# Patient Record
Sex: Male | Born: 1970
Health system: Southern US, Community
[De-identification: ages and names within clinical notes are randomized; demographics above are authoritative.]

## PROBLEM LIST (undated history)

## (undated) DIAGNOSIS — R7309 Other abnormal glucose: Secondary | ICD-10-CM

## (undated) DIAGNOSIS — I10 Essential (primary) hypertension: Secondary | ICD-10-CM

## (undated) DIAGNOSIS — R9431 Abnormal electrocardiogram [ECG] [EKG]: Secondary | ICD-10-CM

## (undated) HISTORY — PX: JOINT REPLACEMENT: SHX530

---
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
## 1996-04-22 HISTORY — PX: ANTERIOR CRUCIATE LIGAMENT REPAIR: SHX115

## 2018-11-02 ENCOUNTER — Emergency Department (HOSPITAL_COMMUNITY)
Admission: EM | Admit: 2018-11-02 | Discharge: 2018-11-03 | Disposition: A | Discharge status: Home / Self Care | Payer: Self-pay | Attending: Emergency Medicine | Admitting: Emergency Medicine

## 2018-11-02 ENCOUNTER — Emergency Department (HOSPITAL_COMMUNITY): Payer: Self-pay

## 2018-11-02 ENCOUNTER — Encounter (HOSPITAL_COMMUNITY): Payer: Self-pay | Admitting: *Deleted

## 2018-11-02 DIAGNOSIS — R42 Dizziness and giddiness: Secondary | ICD-10-CM | POA: Insufficient documentation

## 2018-11-02 DIAGNOSIS — Z5321 Procedure and treatment not carried out due to patient leaving prior to being seen by health care provider: Secondary | ICD-10-CM | POA: Insufficient documentation

## 2018-11-02 DIAGNOSIS — R079 Chest pain, unspecified: Secondary | ICD-10-CM | POA: Insufficient documentation

## 2018-11-02 HISTORY — DX: Essential (primary) hypertension: I10

## 2018-11-02 LAB — CBC
HCT: 47.5 % (ref 39.0–52.0)
Hemoglobin: 16.9 g/dL (ref 13.0–17.0)
MCH: 33 pg (ref 26.0–34.0)
MCHC: 35.6 g/dL (ref 30.0–36.0)
MCV: 92.8 fL (ref 80.0–100.0)
Platelets: 164 10*3/uL (ref 150–400)
RBC: 5.12 MIL/uL (ref 4.22–5.81)
RDW: 11.7 % (ref 11.5–15.5)
WBC: 7.8 10*3/uL (ref 4.0–10.5)
nRBC: 0 % (ref 0.0–0.2)

## 2018-11-02 LAB — BASIC METABOLIC PANEL
Anion gap: 15 (ref 5–15)
BUN: 7 mg/dL (ref 6–20)
CO2: 19 mmol/L — ABNORMAL LOW (ref 22–32)
Calcium: 9.7 mg/dL (ref 8.9–10.3)
Chloride: 97 mmol/L — ABNORMAL LOW (ref 98–111)
Creatinine, Ser: 0.78 mg/dL (ref 0.61–1.24)
GFR calc Af Amer: >60 mL/min (ref >60)
GFR calc non Af Amer: >60 mL/min (ref >60)
Glucose, Bld: 191 mg/dL — ABNORMAL HIGH (ref 70–99)
Potassium: 3.5 mmol/L (ref 3.5–5.1)
Sodium: 131 mmol/L — ABNORMAL LOW (ref 135–145)

## 2018-11-02 LAB — TROPONIN I (HIGH SENSITIVITY): Troponin I (High Sensitivity): 11 ng/L (ref <18)

## 2018-11-02 MED ORDER — SODIUM CHLORIDE 0.9% FLUSH: 3.0000 mL | Freq: Once | INTRAVENOUS | Status: DC

## 2018-11-02 NOTE — ED Triage Notes (Signed)
To ED for eval of HTN, dizziness, and chest pressure in center of chest. States he was recently at the beach and forgot bp meds (a couple of days). Today became dizzy and with chest pressure. Took bp at home and states it was 170/130 and HR 140. BP meds taken pta. Minimal chest pressure currently. Dizziness intermittent. No neuro deficits noted. Nausea and vomiting over the past couple of days.

## 2018-11-03 NOTE — ED Notes (Signed)
Pt stated he was going to leave. Seen walking out of ED waiting room to parking lot

## 2018-11-04 ENCOUNTER — Ambulatory Visit
Admission: RE | Admit: 2018-11-04 | Discharge: 2018-11-04 | Disposition: A | Discharge status: Home / Self Care | Payer: 59 | Source: Ambulatory Visit | Attending: Internal Medicine | Admitting: Internal Medicine

## 2018-11-04 ENCOUNTER — Other Ambulatory Visit: Payer: Self-pay | Admitting: Internal Medicine

## 2018-11-04 DIAGNOSIS — R0600 Dyspnea, unspecified: Secondary | ICD-10-CM

## 2018-11-04 MED ORDER — IOPAMIDOL (ISOVUE-370) INJECTION 76%
75.0000 mL | Freq: Once | INTRAVENOUS | Status: AC | PRN
  Administered 2018-11-04: 75 mL via INTRAVENOUS

## 2018-11-11 DIAGNOSIS — R7309 Other abnormal glucose: Secondary | ICD-10-CM

## 2018-11-11 DIAGNOSIS — R9431 Abnormal electrocardiogram [ECG] [EKG]: Secondary | ICD-10-CM

## 2018-11-11 DIAGNOSIS — I10 Essential (primary) hypertension: Secondary | ICD-10-CM | POA: Insufficient documentation

## 2018-11-11 HISTORY — DX: Other abnormal glucose: R73.09

## 2018-11-11 HISTORY — DX: Abnormal electrocardiogram (ECG) (EKG): R94.31

## 2018-11-11 NOTE — Progress Notes (Signed)
Cardiology Office Note:    Date:  11/12/2018   ID:  Nicolas Harris, DOB 06/07/1970, MRN [id number]  PCP:  Trey SailorsPa, [facility]  Cardiologist:  Norman HerrlichBrian Munley, MD   Referring MD: No ref. provider found  ASSESSMENT:    1. Edema of both lower extremities   2. Abnormal EKG   3. Essential hypertension   4. Elevated glucose   5. SOB (shortness of breath)   6. Abnormal LFTs    PLAN:    In order of problems listed above:  1. I suspect his edema is related to liver disease but is at risk for heart failure with hypertension check proBNP level echocardiogram.  If these are normal I will see back in the office as needed 2. In the setting of uncontrolled hypertension EKG normal today check echocardiogram regarding LVH left ventricular function 3. Markedly improved remain on his beta-blocker if he has CNS side effects Bystolic would be a good choice if he requires another agent and given a distal diuretic and I gave him a prescription for clonidine to take in the future for systolics 180 or greater and diastolics greater than 130 4. Elevated glucose he is committed to lifestyle and diet changes presently not on diabetic medication 5. Associated with uncontrolled hypertension resolved check echocardiogram proBNP level 6. I suspect he does have cirrhosis and steatosis responsible for his peripheral edema beta-blocker is a good choice for hypertension as it lowers portal pressures if a diuretic is needed I do spironolactone or Inspra.  Next appointment as needed depending on the results of echocardiogram   Medication Adjustments/Labs and Tests Ordered: Current medicines are reviewed at length with the patient today.  Concerns regarding medicines are outlined above.  Orders Placed This Encounter  Procedures  . Pro b natriuretic peptide (BNP)[facility])  . ECHOCARDIOGRAM COMPLETE   Meds ordered this encounter  Medications  . cloNIDine (CATAPRES) 0.1 MG  tablet    Sig: Take 1 tablet (0.1 mg total) by mouth daily. For systolic greater than 180 or diastolic greater than 120    Dispense:  30 tablet    Refill:  0     No chief complaint on file.   History of Present Illness:    Nicolas Harris is a 48 y.o. male who is being seen today for the evaluation of hypertension abnormal EKG and edema at the request of the patient  EKG 11/02/2018 in the ED showed sinus rhythm abnormal R wave progression no acute ischemic changes.  Triage notes that the patient had missed blood pressure medications recorded BP of 170/130 in setting of complaints of dizziness and chest pressure.  Patient subsequently left the emergency room without further evaluation.  A BMP was performed which showed a glucose of 191 potassium 3.5 sodium 131 a CBC was normal and a high sensitive troponin was in normal range.  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 He has a background history of hypertension has been on medication stopped about 3 years ago since then has gained weight sedentary and alcohol usage. He was on vacation felt as if his blood pressure was high and he was lightheaded and weak some blurred vision short of breath decided to go the emergency room and had systolics in his measurements above 170 and diastolics up to 150.  He sat for long period of time in the emergency room received no treatment and decided to leave.  He was started  on a beta-blocker and his blood pressure is now at target.  He feels much better his weight is down 10 pounds he is not short of breath and he said he never had chest pain during the episodes.  He has been seen by his PCP he has abnormal liver function and tells me a duplex of his liver shows early cirrhosis.  He also has peripheral edema but no orthopnea chest pain palpitation or syncope and no known history of heart disease.  Past Medical History:  Diagnosis Date  . Abnormal EKG 11/11/2018  . Elevated  glucose 11/11/2018  . Hypertension     Past Surgical History:  Procedure Laterality Date  . ANTERIOR CRUCIATE LIGAMENT REPAIR Right 1998  . JOINT REPLACEMENT      Current Medications: Current Meds  Medication Sig  . metoprolol tartrate (LOPRESSOR) 50 MG tablet Take 50 mg by mouth 2 (two) times daily.  . Multiple Vitamin (MULTIVITAMIN) tablet Take 1 tablet by mouth daily.     Allergies:   Patient has no known allergies.   Social History   Socioeconomic History  . Marital status: Divorced    Spouse name: Not on file  . Number of children: Not on file  . Years of education: Not on file  . Highest education level: Not on file  Occupational History  . Not on file  Social Needs  . Financial resource strain: Not on file  . Food insecurity    Worry: Not on file    Inability: Not on file  . Transportation needs    Medical: Not on file    Non-medical: Not on file  Tobacco Use  . Smoking status: Current Every Day Smoker    Packs/day: 0.50    Years: 20.00    Pack years: 10.00    Types: Cigarettes  . Smokeless tobacco: Never Used  Substance and Sexual Activity  . Alcohol use: Not Currently  . Drug use: Not Currently  . Sexual activity: Not on file  Lifestyle  . Physical activity    Days per week: Not on file    Minutes per session: Not on file  . Stress: Not on file  Relationships  . Social Musicianconnections    Talks on phone: Not on file    Gets together: Not on file    Attends religious service: Not on file    Active member of club or organization: Not on file    Attends meetings of clubs or organizations: Not on file    Relationship status: Not on file  Other Topics Concern  . Not on file  Social History Narrative  . Not on file     Family History: The patient's family history includes COPD in his mother; Cancer in his father.  ROS:   Review of Systems  Constitution: Positive for malaise/fatigue.  HENT: Negative.   Eyes: Positive for visual disturbance.   Cardiovascular: Positive for dyspnea on exertion and leg swelling.  Respiratory: Positive for shortness of breath.   Endocrine: Negative.   Hematologic/Lymphatic: Negative.   Skin: Negative.   Musculoskeletal: Negative.   Gastrointestinal: Negative.   Genitourinary: Negative.   Neurological: Positive for dizziness, light-headedness and weakness.  Psychiatric/Behavioral: Negative.   Allergic/Immunologic: Negative.    Please see the history of present illness.     All other systems reviewed and are negative.  EKGs/Labs/Other Studies Reviewed:    The following studies were reviewed today:   EKG:  EKG is  ordered today.  The ekg  ordered today is personally reviewed and demonstrates [place] normal EKG  Recent Labs: 11/02/2018: BUN 7; Creatinine, Ser 0.78; Hemoglobin 16.9; Platelets 164; Potassium 3.5; Sodium 131  Recent Lipid Panel No results found for: CHOL, TRIG, HDL, CHOLHDL, VLDL, LDLCALC, LDLDIRECT  Physical Exam:    VS:  BP 112/82 (BP Location: Left Arm, Patient Position: Sitting, Cuff Size: Large)   Pulse 71   Ht 6' (1.829 m)   Wt 263 lb 6.4 oz (119.5 kg)   SpO2 97%   BMI 35.72 kg/m     Wt Readings from Last 3 Encounters:  11/12/18 263 lb 6.4 oz (119.5 kg)     GEN:  Well nourished, well developed in no acute distress HEENT: Normal NECK: No JVD; No carotid bruits LYMPHATICS: No lymphadenopathy CARDIAC: RRR, no murmurs, rubs, gallops RESPIRATORY:  Clear to auscultation without rales, wheezing or rhonchi  ABDOMEN: Soft, non-tender, non-distended MUSCULOSKELETAL:  1-2+ pitting bilateral lower extremity ankle to knee edema; No deformity  SKIN: Warm and dry NEUROLOGIC:  Alert and oriented x 3 PSYCHIATRIC:  Normal affect     Signed, Shirlee More, MD  11/12/2018 11:39 AM    [place]

## 2018-11-12 ENCOUNTER — Ambulatory Visit (INDEPENDENT_AMBULATORY_CARE_PROVIDER_SITE_OTHER): Payer: 59 | Admitting: Cardiology

## 2018-11-12 ENCOUNTER — Other Ambulatory Visit: Payer: Self-pay

## 2018-11-12 ENCOUNTER — Other Ambulatory Visit: Payer: Self-pay | Admitting: Internal Medicine

## 2018-11-12 VITALS — BP 112/82 | HR 71 | Ht 72.0 in | Wt 263.4 lb

## 2018-11-12 DIAGNOSIS — R9431 Abnormal electrocardiogram [ECG] [EKG]: Secondary | ICD-10-CM | POA: Diagnosis not present

## 2018-11-12 DIAGNOSIS — R7309 Other abnormal glucose: Secondary | ICD-10-CM | POA: Diagnosis not present

## 2018-11-12 DIAGNOSIS — R600 Localized edema: Secondary | ICD-10-CM

## 2018-11-12 DIAGNOSIS — I10 Essential (primary) hypertension: Secondary | ICD-10-CM | POA: Diagnosis not present

## 2018-11-12 DIAGNOSIS — R945 Abnormal results of liver function studies: Secondary | ICD-10-CM

## 2018-11-12 DIAGNOSIS — R7989 Other specified abnormal findings of blood chemistry: Secondary | ICD-10-CM | POA: Insufficient documentation

## 2018-11-12 DIAGNOSIS — R0602 Shortness of breath: Secondary | ICD-10-CM

## 2018-11-12 MED ORDER — CLONIDINE HCL 0.1 MG PO TABS: 0.1000 mg | ORAL_TABLET | Freq: Every day | ORAL | 0 refills | Status: DC

## 2018-11-12 NOTE — Patient Instructions (Signed)
Medication Instructions:  Your physician has recommended you make the following change in your medication:   START: Clonidine 0.1 mg: Take 1 tab daily if systolic BP is greater than 180 or diastolic greater than 120.  If you need a refill on your cardiac medications before your next appointment, please call your pharmacy.   Lab work: Your physician recommends that you return for lab work in: TODAY Pro BNP  If you have labs (blood work) drawn today and your tests are completely normal, you will receive your results only by: Marland Kitchen. MyChart Message (if you have MyChart) OR . A paper copy in the mail If you have any lab test that is abnormal or we need to change your treatment, we will call you to review the results.  Testing/Procedures: Your physician has requested that you have an echocardiogram. Echocardiography is a painless test that uses sound waves to create images of your heart. It provides your doctor with information about the size and shape of your heart and how well your heart's chambers and valves are working. This procedure takes approximately one hour. There are no restrictions for this procedure.    Follow-Up: At [facility], you and your health needs are our priority.  As part of our continuing mission to provide you with exceptional heart care, we have created designated Provider Care Teams.  These Care Teams include your primary Cardiologist (physician) and Advanced Practice Providers (APPs -  Physician Assistants and Nurse Practitioners) who all work together to provide you with the care you need, when you need it. You will need a follow up appointment as needed with Dr. Dulce SellarMunley Any Other Special Instructions Will Be Listed Below (If Applicable).   Echocardiogram An echocardiogram is a procedure that uses painless sound waves (ultrasound) to produce an image of the heart. Images from an echocardiogram can provide important information about:  Signs of coronary artery  disease (CAD).  Aneurysm detection. An aneurysm is a weak or damaged part of an artery wall that bulges out from the normal force of blood pumping through the body.  Heart size and shape. Changes in the size or shape of the heart can be associated with certain conditions, including heart failure, aneurysm, and CAD.  Heart muscle function.  Heart valve function.  Signs of a past heart attack.  Fluid buildup around the heart.  Thickening of the heart muscle.  A tumor or infectious growth around the heart valves. Tell a health care provider about:  Any allergies you have.  All medicines you are taking, including vitamins, herbs, eye drops, creams, and over-the-counter medicines.  Any blood disorders you have.  Any surgeries you have had.  Any medical conditions you have.  Whether you are pregnant or may be pregnant. What are the risks? Generally, this is a safe procedure. However, problems may occur, including:  Allergic reaction to dye (contrast) that may be used during the procedure. What happens before the procedure? No specific preparation is needed. You may eat and drink normally. What happens during the procedure?   An IV tube may be inserted into one of your veins.  You may receive contrast through this tube. A contrast is an injection that improves the quality of the pictures from your heart.  A gel will be applied to your chest.  A wand-like tool (transducer) will be moved over your chest. The gel will help to transmit the sound waves from the transducer.  The sound waves will harmlessly bounce off of your  heart to allow the heart images to be captured in real-time motion. The images will be recorded on a computer. The procedure may vary among health care providers and hospitals. What happens after the procedure?  You may return to your normal, everyday life, including diet, activities, and medicines, unless your health care provider tells you not to do that.  Summary  An echocardiogram is a procedure that uses painless sound waves (ultrasound) to produce an image of the heart.  Images from an echocardiogram can provide important information about the size and shape of your heart, heart muscle function, heart valve function, and fluid buildup around your heart.  You do not need to do anything to prepare before this procedure. You may eat and drink normally.  After the echocardiogram is completed, you may return to your normal, everyday life, unless your health care provider tells you not to do that. This information is not intended to replace advice given to you by your health care provider. Make sure you discuss any questions you have with your health care provider. Document Released: 04/05/2000 Document Revised: 07/30/2018 Document Reviewed: 05/11/2016 Elsevier Patient Education  2020 [employer].

## 2018-11-13 ENCOUNTER — Ambulatory Visit (HOSPITAL_COMMUNITY): Payer: 59 | Attending: Cardiology

## 2018-11-13 DIAGNOSIS — I10 Essential (primary) hypertension: Secondary | ICD-10-CM | POA: Insufficient documentation

## 2018-11-13 LAB — PRO B NATRIURETIC PEPTIDE: NT-Pro BNP: 33 pg/mL (ref 0–121)

## 2018-11-20 ENCOUNTER — Ambulatory Visit
Admission: RE | Admit: 2018-11-20 | Discharge: 2018-11-20 | Disposition: A | Discharge status: Home / Self Care | Payer: 59 | Source: Ambulatory Visit | Attending: Internal Medicine | Admitting: Internal Medicine

## 2018-11-20 DIAGNOSIS — R7989 Other specified abnormal findings of blood chemistry: Secondary | ICD-10-CM

## 2021-01-17 ENCOUNTER — Ambulatory Visit
Admission: EM | Admit: 2021-01-17 | Discharge: 2021-01-17 | Disposition: A | Discharge status: Home / Self Care | Payer: PRIVATE HEALTH INSURANCE | Attending: Internal Medicine | Admitting: Internal Medicine

## 2021-01-17 ENCOUNTER — Other Ambulatory Visit: Payer: Self-pay

## 2021-01-17 ENCOUNTER — Encounter: Payer: Self-pay | Admitting: Emergency Medicine

## 2021-01-17 DIAGNOSIS — T148XXA Other injury of unspecified body region, initial encounter: Secondary | ICD-10-CM | POA: Diagnosis not present

## 2021-01-17 MED ORDER — CYCLOBENZAPRINE HCL 5 MG PO TABS: 5.0000 mg | ORAL_TABLET | Freq: Every evening | ORAL | 1 refills | Status: AC | PRN

## 2021-01-17 MED ORDER — HYDROCODONE-ACETAMINOPHEN 5-325 MG PO TABS: 1.0000 | ORAL_TABLET | Freq: Four times a day (QID) | ORAL | 0 refills | Status: AC | PRN

## 2021-01-17 NOTE — ED Triage Notes (Signed)
Pt presents today with c/o of pain to right buttock area that he believes that he has injured his hamstring. 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 He also reports bruising to area.

## 2021-01-17 NOTE — Discharge Instructions (Addendum)
Icing of the contused area Gentle range of motion exercises Take medications as prescribed Do not operate a vehicle or heavy machinery after taking muscle relaxants.  Muscle relaxants will make you drowsy. Return to urgent care to be reevaluated if symptoms worsen.

## 2021-01-17 NOTE — ED Provider Notes (Signed)
[facility]    CSN: [id number] Arrival date & time: 01/17/21  1236      History   Chief Complaint Chief Complaint  Patient presents with   Leg Pain    right    HPI Nicolas Harris is a 50 y.o. male comes to the urgent care with right thigh pain of 1 day duration.  Patient was playing soccer yesterday when he rolled his right leg over the soccer ball.  He lost his balance but was able to prevent himself from falling.  Patient is describing sharp, throbbing pain over the right hamstrings.  Pain is aggravated by movement.  No known relieving factors.  The hamstrings feel tight.  Bruising is noted over the right hamstring.  No weakness in the lower extremities.   HPI  Past Medical History:  Diagnosis Date   Abnormal EKG 11/11/2018   Elevated glucose 11/11/2018   Hypertension     Patient Active Problem List   Diagnosis Date Noted   Edema of both lower extremities 11/12/2018   SOB (shortness of breath) 11/12/2018   Abnormal LFTs 11/12/2018   Abnormal EKG 11/11/2018   Hypertension 11/11/2018   Elevated glucose 11/11/2018    Past Surgical History:  Procedure Laterality Date   ANTERIOR CRUCIATE LIGAMENT REPAIR Right 1998   JOINT REPLACEMENT         Home Medications    Prior to Admission medications   Medication Sig Start Date End Date Taking? Authorizing Provider  cyclobenzaprine (FLEXERIL) 5 MG tablet Take 1 tablet (5 mg total) by mouth at bedtime as needed for muscle spasms. 01/17/21  Yes Wendelin Reader, Britta Mccreedy, MD  HYDROcodone-acetaminophen (NORCO/VICODIN) 5-325 MG tablet Take 1 tablet by mouth every 6 (six) hours as needed. 01/17/21  Yes Carson Meche, Britta Mccreedy, MD  metoprolol tartrate (LOPRESSOR) 50 MG tablet Take 50 mg by mouth 2 (two) times daily.    [provider]  Multiple Vitamin (MULTIVITAMIN) tablet Take 1 tablet by mouth daily.    [provider]  sertraline (ZOLOFT) 100 MG tablet Take 50 mg by mouth daily. 12/15/20   [provider]    Family History Family History  Problem Relation Age of Onset   COPD Mother    Cancer Father     Social History Social History   Tobacco Use   Smoking status: Every Day    Packs/day: 0.50    Years: 20.00    Pack years: 10.00    Types: Cigarettes   Smokeless tobacco: Never  Vaping Use   Vaping Use: Never used  Substance Use Topics   Alcohol use: Not Currently   Drug use: Not Currently     Allergies   Patient has no known allergies.   Review of Systems Review of Systems  Constitutional: Negative.   Respiratory: Negative.    Cardiovascular: Negative.   Gastrointestinal: Negative.   Musculoskeletal:  Positive for myalgias. Negative for arthralgias.  Skin:  Positive for color change. Negative for wound.    Physical Exam Triage Vital Signs ED Triage Vitals  Enc Vitals Group     BP 01/17/21 1250 (!) 164/104     Pulse Rate 01/17/21 1250 (!) 114     Resp 01/17/21 1250 20     Temp 01/17/21 1250 98.3 F (36.8 C)     Temp Source 01/17/21 1250 Oral     SpO2 01/17/21 1250 98 %     Weight --      Height --  Head Circumference --      Peak Flow --      Pain Score 01/17/21 1247 6     Pain Loc --      Pain Edu? --      Excl. in GC? --    No data found.  Updated Vital Signs BP (!) 164/104 (BP Location: Left Arm) Comment: pt has not taken bp med today, last taken yesterday  Pulse (!) 114   Temp 98.3 F (36.8 C) (Oral)   Resp 20   SpO2 98%   Visual Acuity Right Eye Distance:   Left Eye Distance:   Bilateral Distance:    Right Eye Near:   Left Eye Near:    Bilateral Near:     Physical Exam Vitals and nursing note reviewed.  Constitutional:      General: He is in acute distress.     Appearance: Normal appearance. He is not ill-appearing or diaphoretic.  Cardiovascular:     Rate and Rhythm: Normal rate and regular rhythm.  Abdominal:     General: Bowel sounds are normal.     Palpations: Abdomen is soft.  Musculoskeletal:        General:  Swelling and signs of injury present. Normal range of motion.  Skin:    Findings: Bruising present. No erythema.  Neurological:     Mental Status: He is alert.     UC Treatments / Results  Labs (all labs ordered are listed, but only abnormal results are displayed) Labs Reviewed - No data to display  EKG   Radiology No results found.  Procedures Procedures (including critical care time)  Medications Ordered in UC Medications - No data to display  Initial Impression / Assessment and Plan / UC Course  I have reviewed the triage vital signs and the nursing notes.  Pertinent labs & imaging results that were available during my care of the patient were reviewed by me and considered in my medical decision making (see chart for details).     .  Right hamstring muscle contusion: Continue ibuprofen as needed for pain Hydrocodone/acetaminophen as needed for pain Flexeril at bedtime as needed for muscle spasms Continue icing the right hamstrings Gentle stretching exercises If symptoms worsen please return to urgent care to be reevaluated. Final Clinical Impressions(s) / UC Diagnoses   Final diagnoses:  Muscle contusion     Discharge Instructions      Icing of the contused area Gentle range of motion exercises Take medications as prescribed Do not operate a vehicle or heavy machinery after taking muscle relaxants.  Muscle relaxants will make you drowsy. Return to urgent care to be reevaluated if symptoms worsen.   ED Prescriptions     Medication Sig Dispense Auth. Provider   cyclobenzaprine (FLEXERIL) 5 MG tablet Take 1 tablet (5 mg total) by mouth at bedtime as needed for muscle spasms. 14 tablet Lucette Kratz, Britta Mccreedy, MD   HYDROcodone-acetaminophen (NORCO/VICODIN) 5-325 MG tablet Take 1 tablet by mouth every 6 (six) hours as needed. 10 tablet Donyea Beverlin, Britta Mccreedy, MD      I have reviewed the PDMP during this encounter.   Merrilee Jansky, MD 01/17/21 1331

## 2021-05-16 IMAGING — DX CHEST - 2 VIEW
2 series · 2 of 2 positions shown · non-contrast
Comparison: None.

CLINICAL DATA: Chest pain, hypertension.

EXAM:
CHEST - 2 VIEW

[w chest pa]
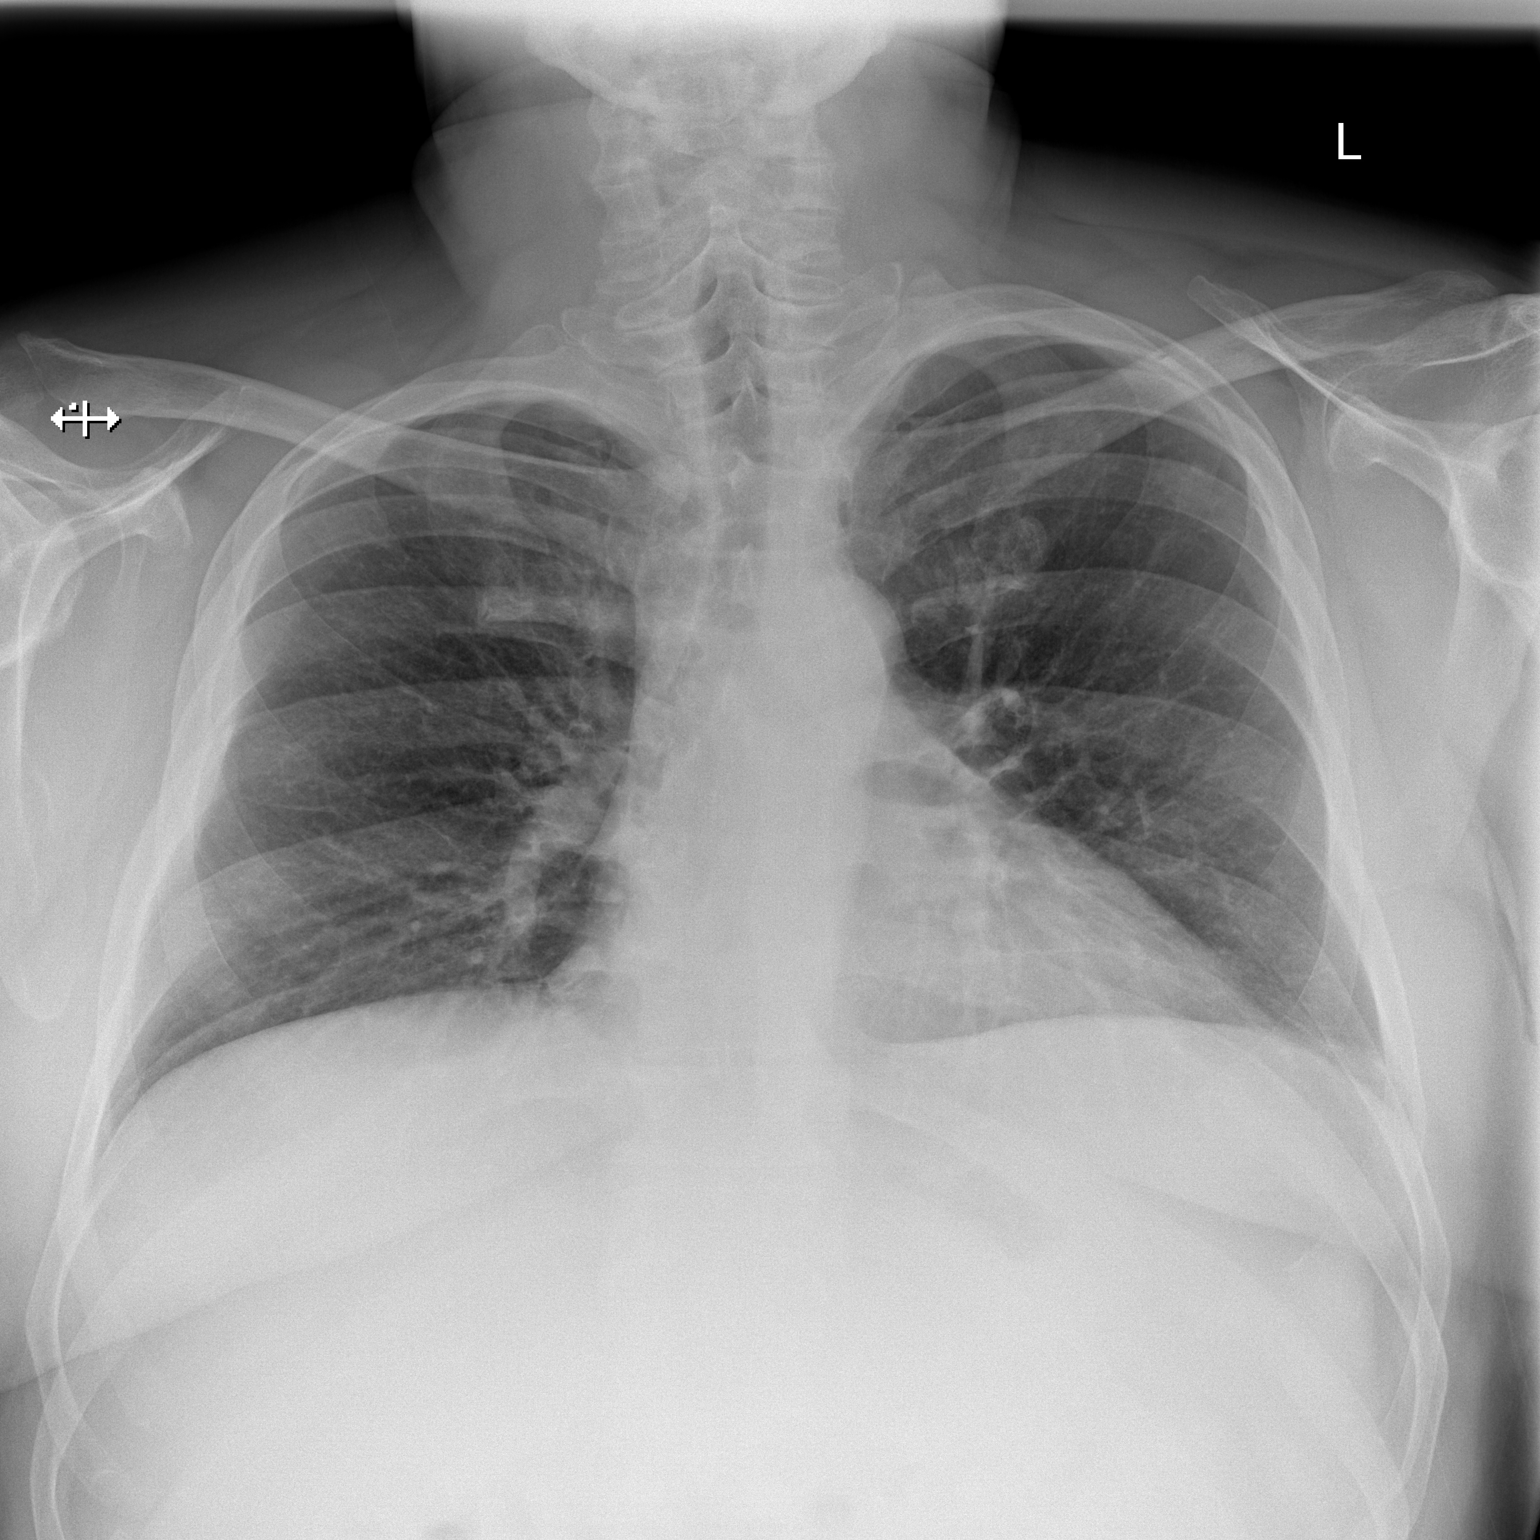

[w chest lat]
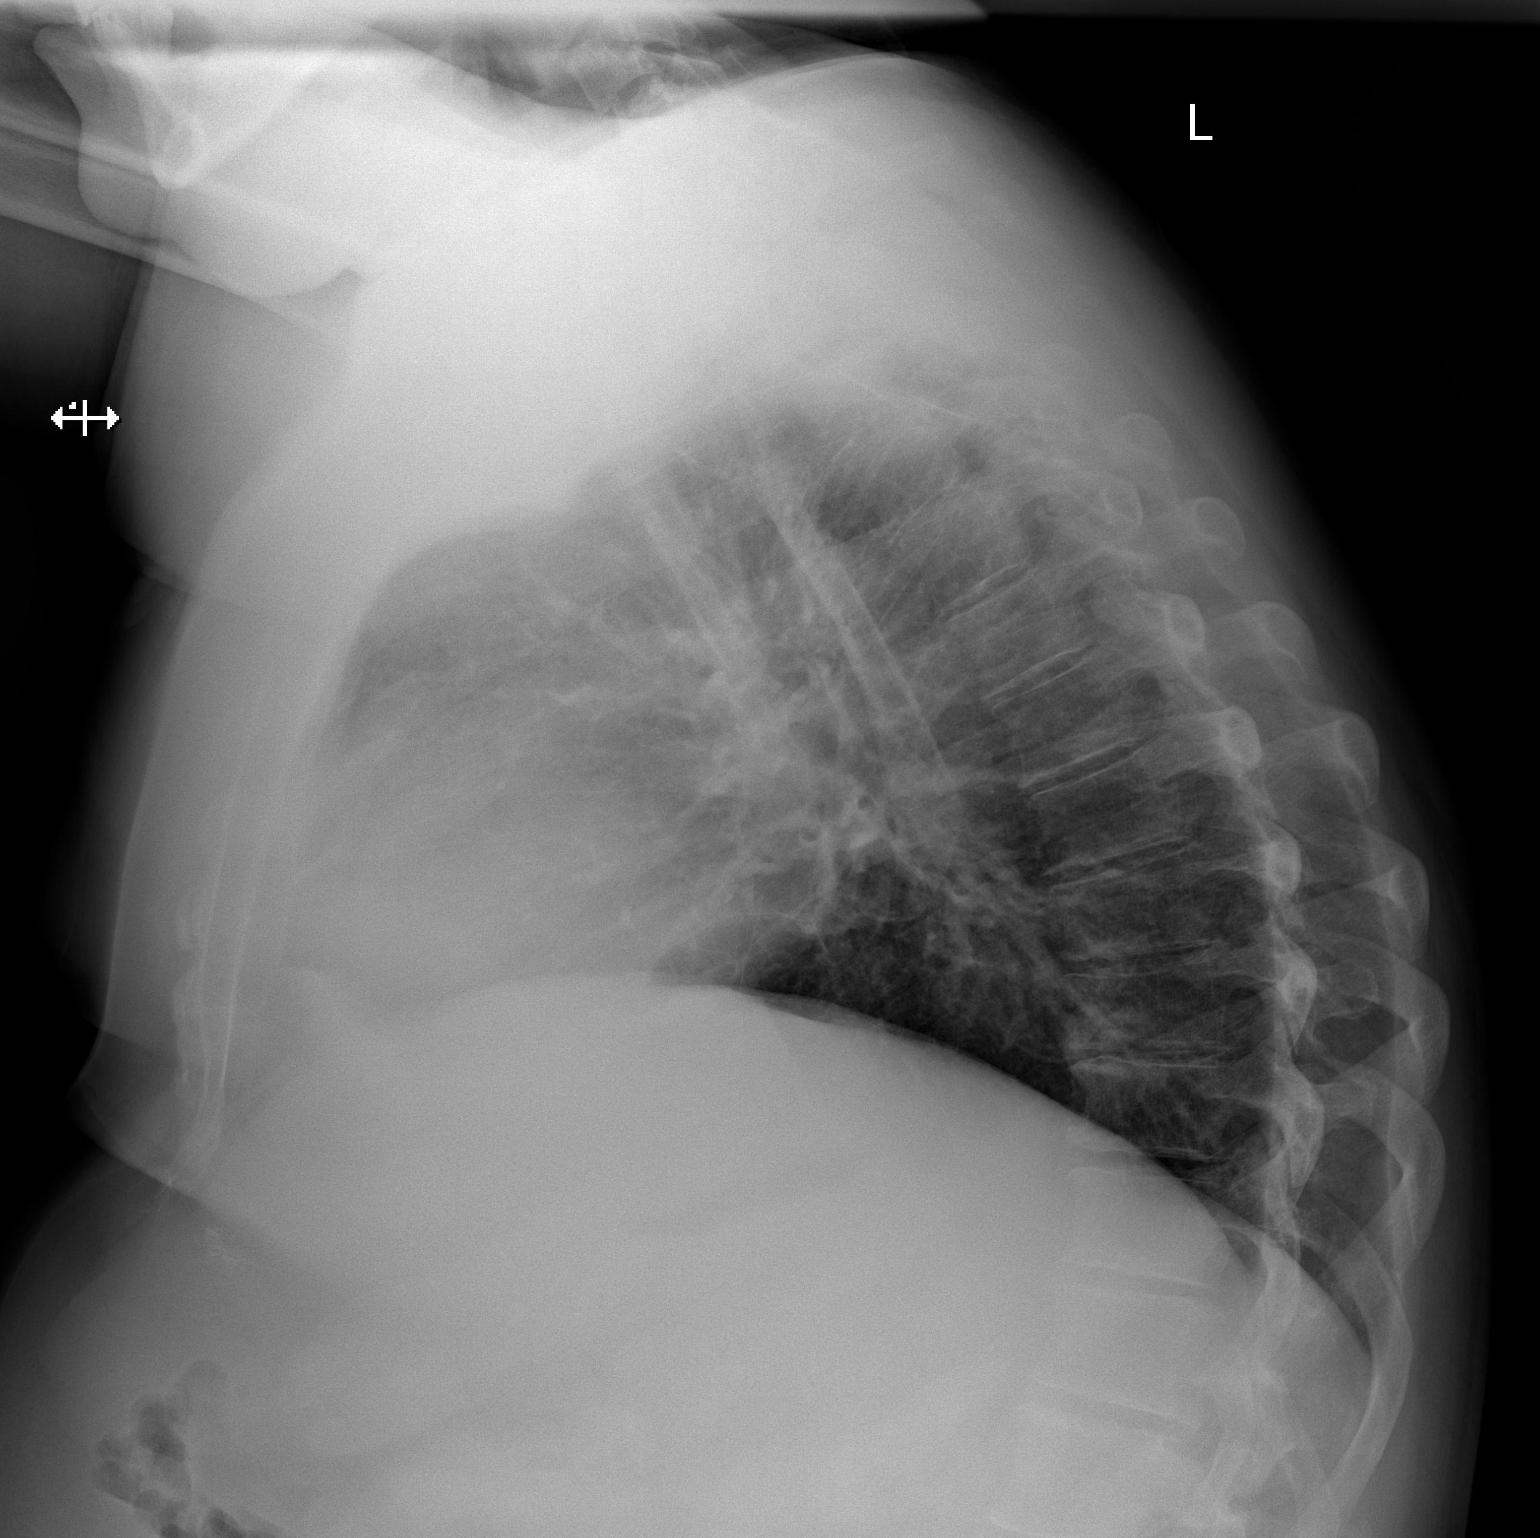

[2 of 2 positions shown; findings below may reference images not displayed]

FINDINGS: Study is hypoinspiratory with crowding of the perihilar and
bibasilar bronchovascular markings. Given the low lung volumes,
lungs appear clear. No pleural effusion or pneumothorax seen.
Osseous structures are unremarkable.
IMPRESSION: Low lung volumes. No active cardiopulmonary disease. No evidence of
pneumonia or pulmonary edema.

## 2021-05-18 IMAGING — CT CT ANGIOGRAPHY CHEST
3 of 8 series · 17 of 36 positions shown · IV contrast (iopamidol)
Comparison: Chest radiograph - 11/02/2018

CLINICAL DATA: Elevated D-dimer and shortness of breath. Fatigue
for the past month. History of smoking. Evaluate for pulmonary
embolism.

EXAM:
CT ANGIOGRAPHY CHEST WITH CONTRAST
TECHNIQUE: Multidetector CT imaging of the chest was performed using the
standard protocol during bolus administration of intravenous
contrast. Multiplanar CT image reconstructions and MIPs were
obtained to evaluate the vascular anatomy.
CONTRAST:  75mL OQKGUO-FXG IOPAMIDOL (OQKGUO-FXG) INJECTION 76%

[Series 8: cta pulmonary 2.00 bv36 s3 · coronal · 0.62mm/px · 1 of 187 slices shown]
[im 94/187  mediastinal]
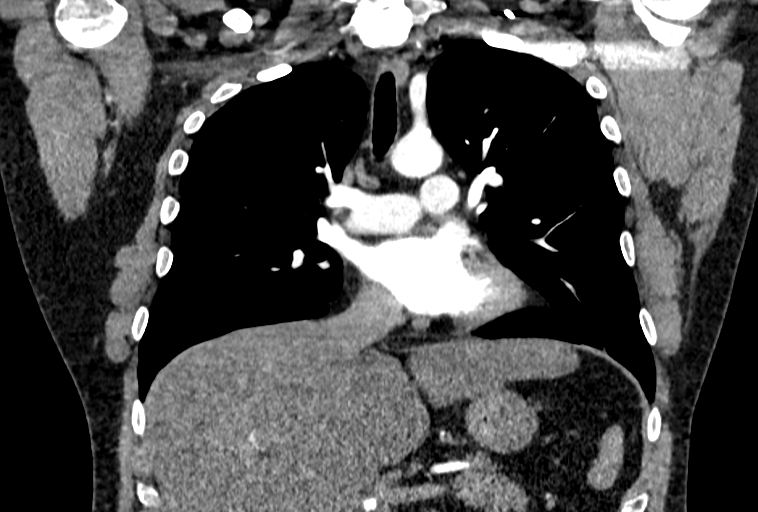

[Series 12: cta pulmonary 1.00 bv36 s3 thins · axial · 0.88mm/px · z∈[+1540,+1778]mm · 7 of 318 slices shown]
[im 40/318  lung]
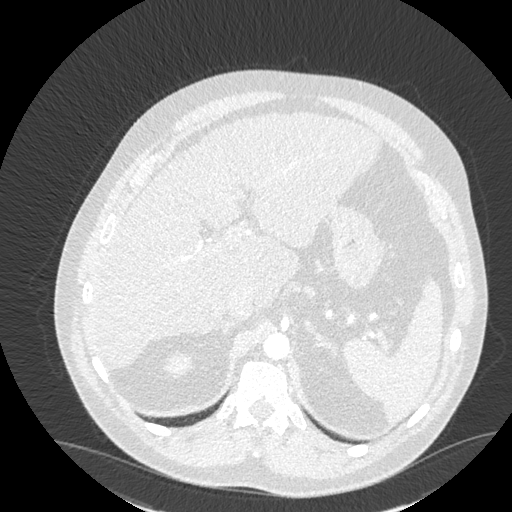
[im 80/318  lung]
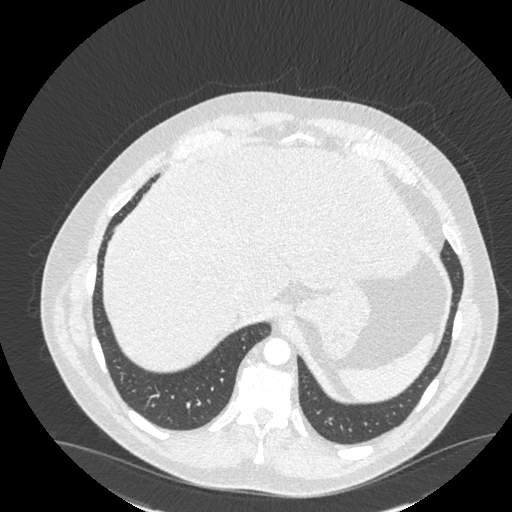
[im 119/318  lung]
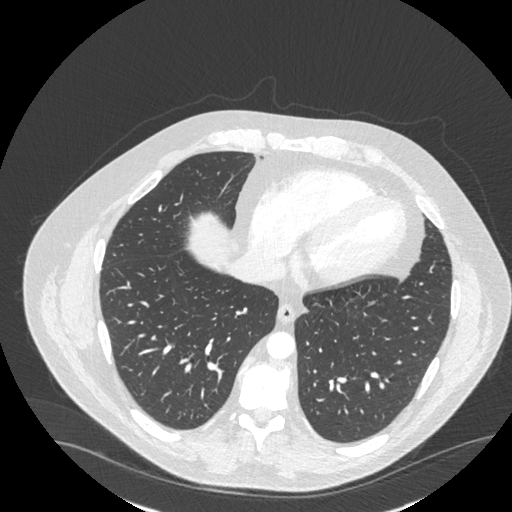
[im 159/318  lung]
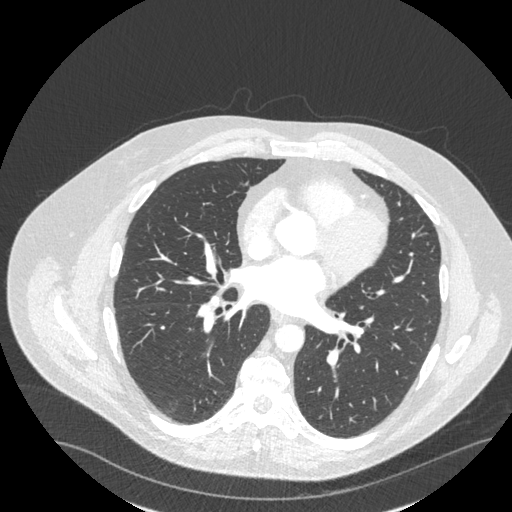
[im 199/318  lung]
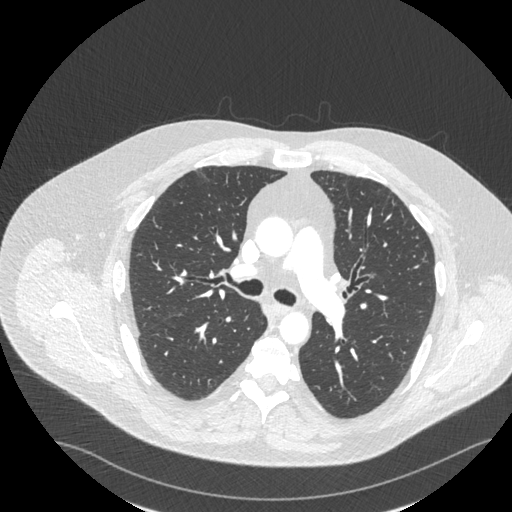
[im 238/318  lung]
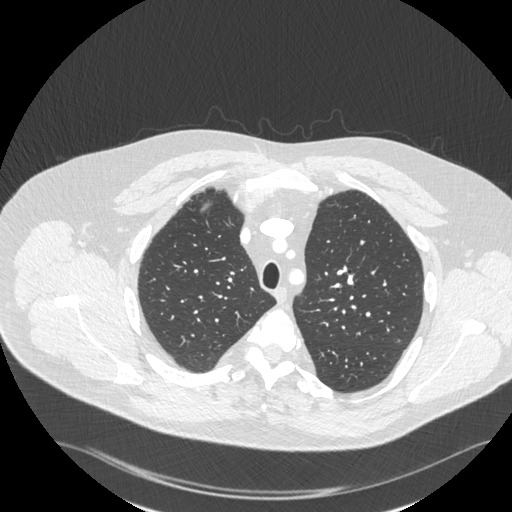
[im 278/318  lung]
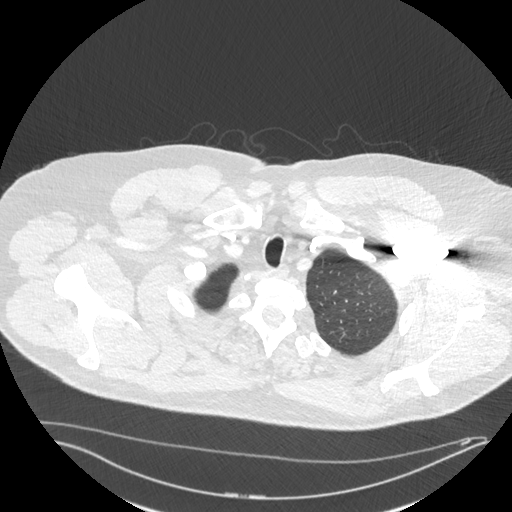

[Series 13: cta pulmonary 1.00 bv36 s3 super d. · axial · 0.87mm/px · z∈[+1534,+1786]mm · 9 of 394 slices shown]
[im 40/394  lung]
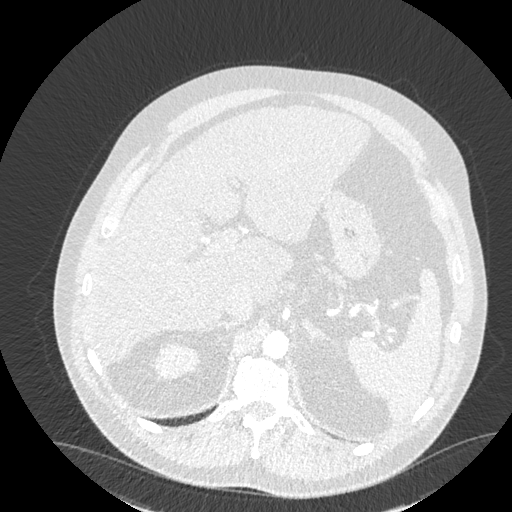
[im 79/394  mediastinal]
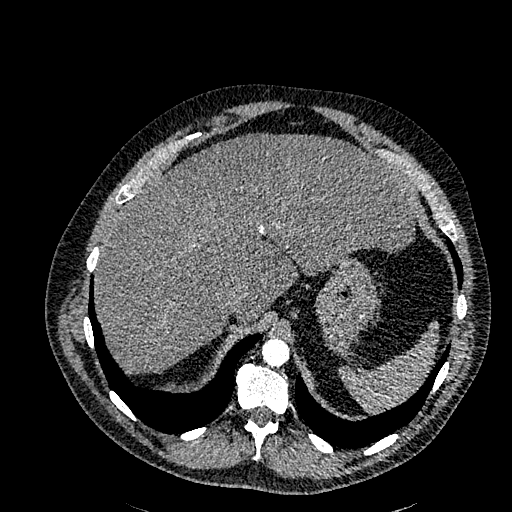
[im 118/394  lung]
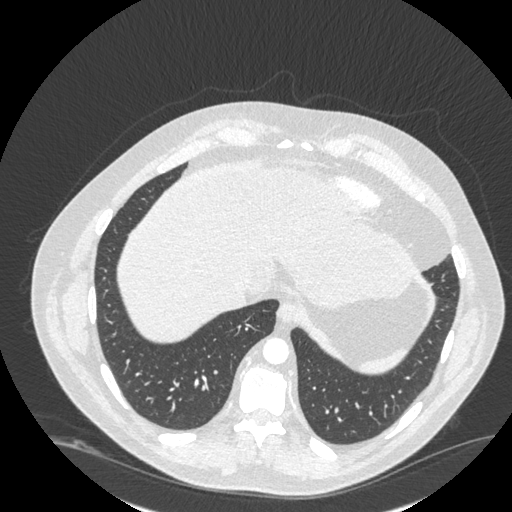
[im 158/394  mediastinal]
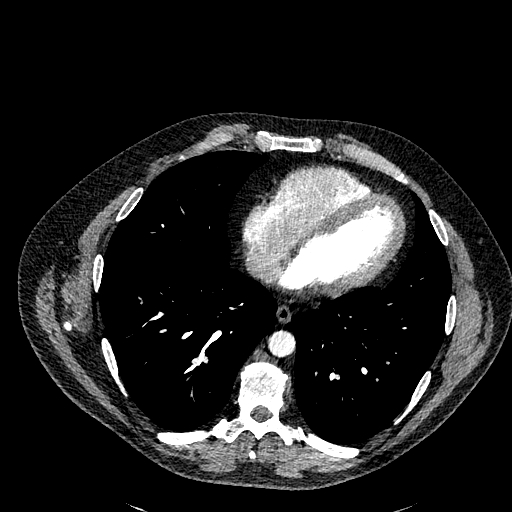
[im 197/394  lung]
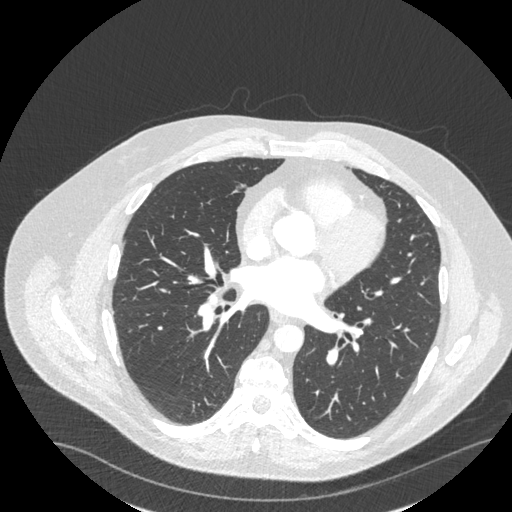
[im 236/394  mediastinal]
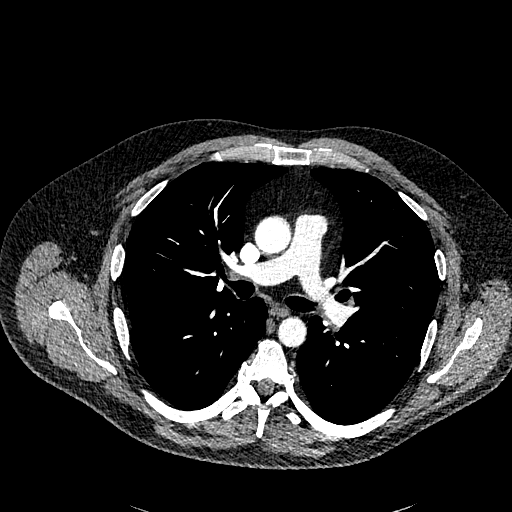
[im 276/394  lung]
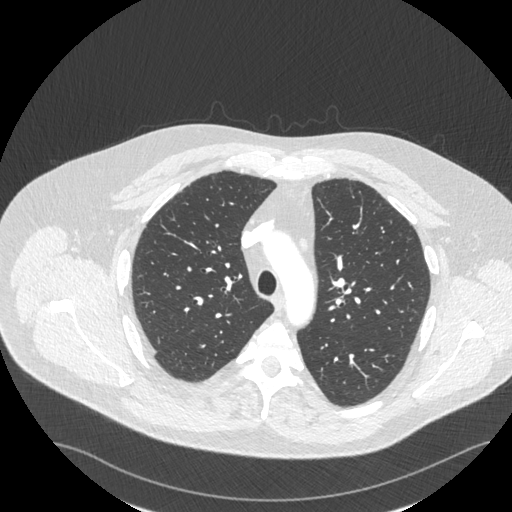
[im 315/394  mediastinal]
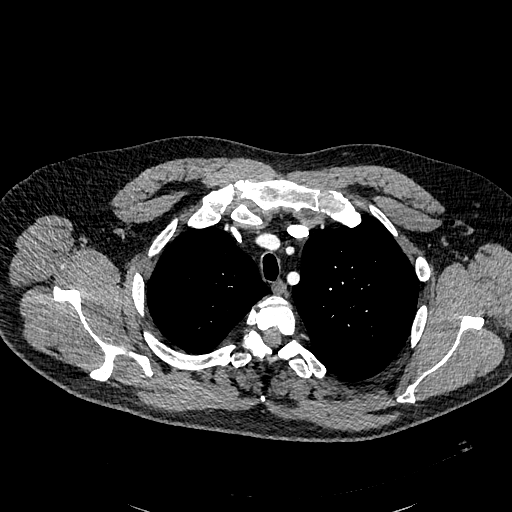
[im 354/394  lung]
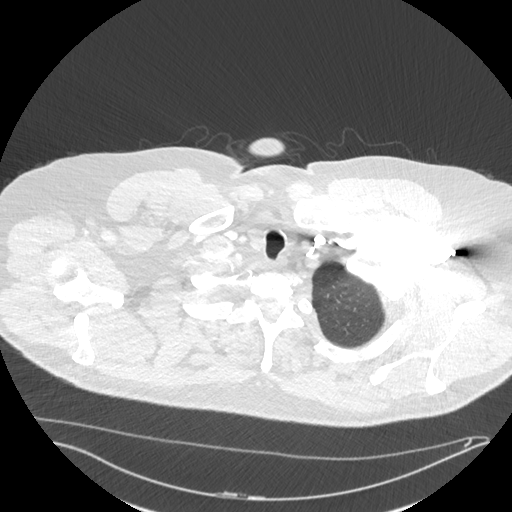

[17 of 36 positions shown; findings below may reference images not displayed]

FINDINGS: Vascular Findings:

There is adequate opacification of the pulmonary arterial system
with the main pulmonary artery measuring 223 Hounsfield units. There
are no discrete filling defects within the pulmonary arterial tree
to suggest pulmonary embolism. Normal caliber of the main pulmonary
artery.

Normal heart size.  No pericardial effusion.

No evidence of thoracic aortic aneurysm or dissection on this
nongated examination. There is a minimal amount of atherosclerotic
plaque involving the origin left subclavian artery (image 41, series
6), not resulting in hemodynamically significant stenosis. The
branch vessels of the aortic arch appear widely patent throughout
their imaged courses.

Review of the MIP images confirms the above findings.

----------------------------------------------------------------------------------

Nonvascular Findings:

Mediastinum/Lymph Nodes: No bulky mediastinal, hilar axillary
lymphadenopathy. Ill-defined soft tissue within the anterior aspect
mediastinum without associated mass effect is favored to represent
residual thymic tissue (representative image 46, series 6).

Lungs/Pleura: No discrete focal airspace opacities. No pleural
effusion or pneumothorax. The central pulmonary airways appear
widely patent.

No discrete pulmonary nodules.

Upper abdomen: Limited early arterial phase evaluation of the upper
abdomen demonstrates diffuse decreased attenuation of the hepatic
parenchyma with nodularity of the hepatic contour (image 139, series
6) as could be seen in the setting of hepatic steatosis and early
cirrhotic change. No splenomegaly or ascites.

Musculoskeletal: No acute or aggressive osseous abnormalities.
Stigmata of DISH within the thoracic spine. DDD of C6-C7 is
suspected though incompletely evaluated. Regional soft tissues
appear normal. Normal appearance of the thyroid gland.
IMPRESSION: 1. No acute cardiopulmonary disease. Specifically, no evidence of
embolism.
2.  Aortic Atherosclerosis ([id number]).
3. Findings suggestive of hepatic steatosis with suspected early
cirrhotic change. Further evaluation with LFTs could be performed as
indicated.

## 2021-06-03 IMAGING — US ULTRASOUND ABDOMEN LIMITED
1 series · 14 of 25 positions shown · non-contrast
Comparison: 11/04/2018 chest CT

CLINICAL DATA: 48-year-old male with elevated LFTs.

EXAM:
ULTRASOUND ABDOMEN LIMITED RIGHT UPPER QUADRANT

[Series 1: ultrasound abdomen limited · 0.23mm/px · 53 acquisitions, 14 frames shown]
[im 1/53]
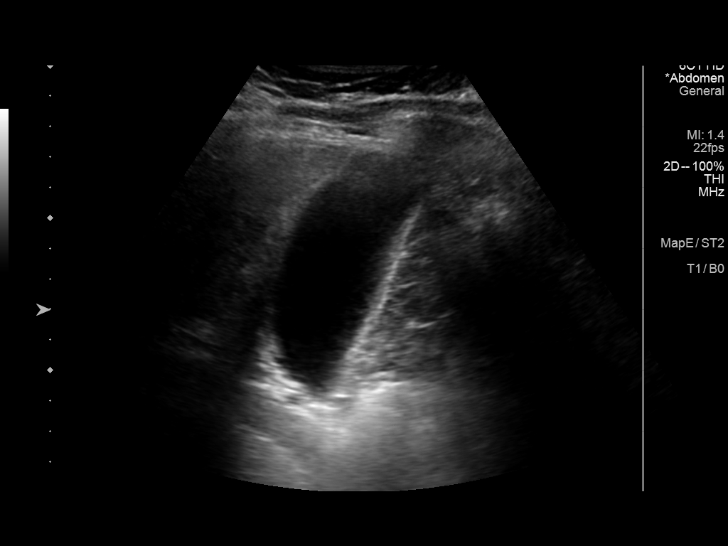
[im 5/53]
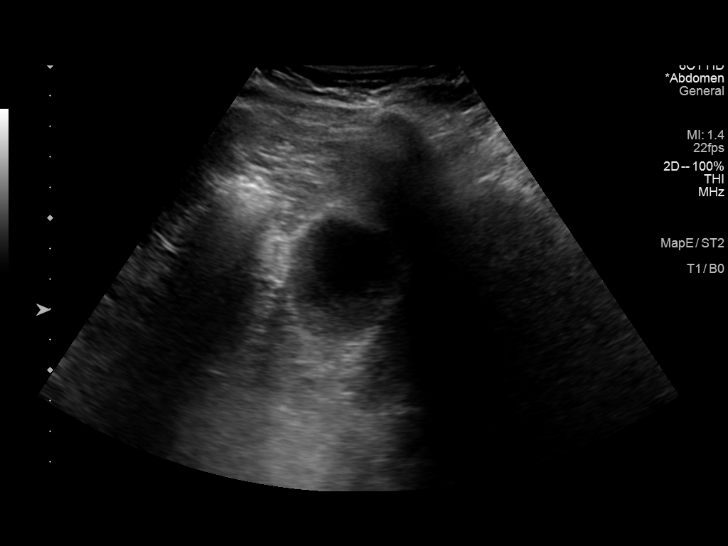
[im 9/53]
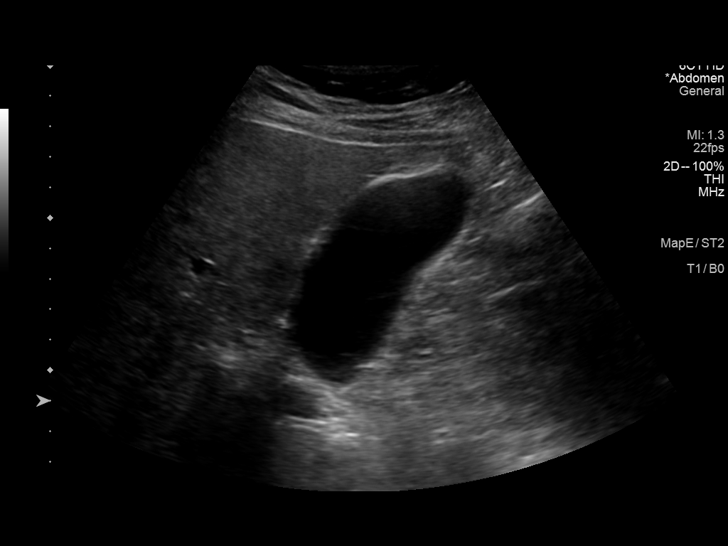
[im 14/53]
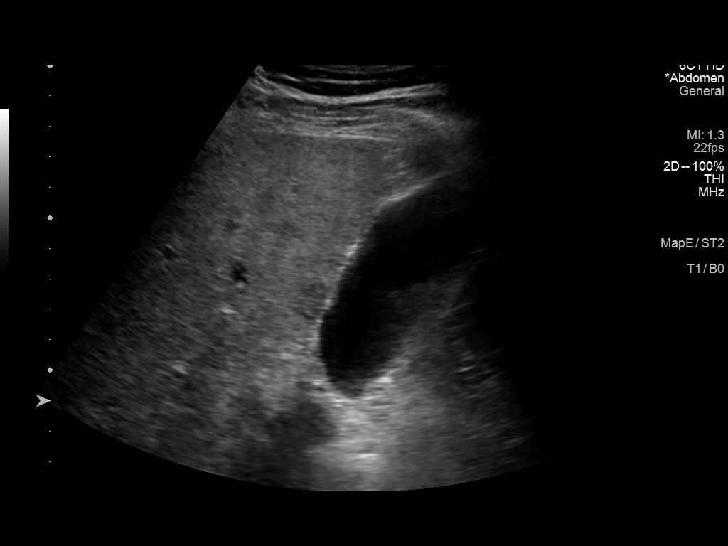
[im 18/53]
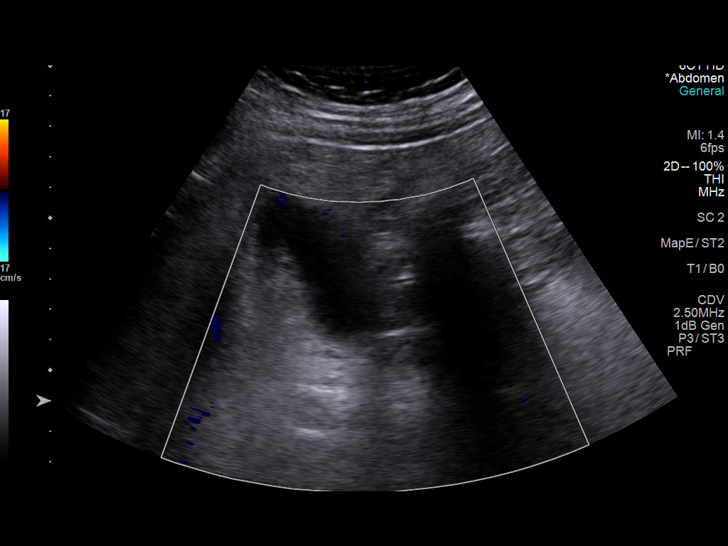
[im 20/53]
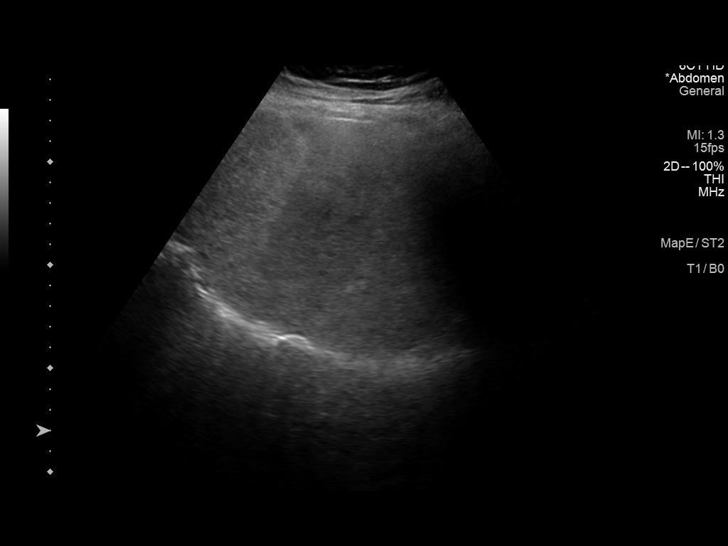
[im 24/53]
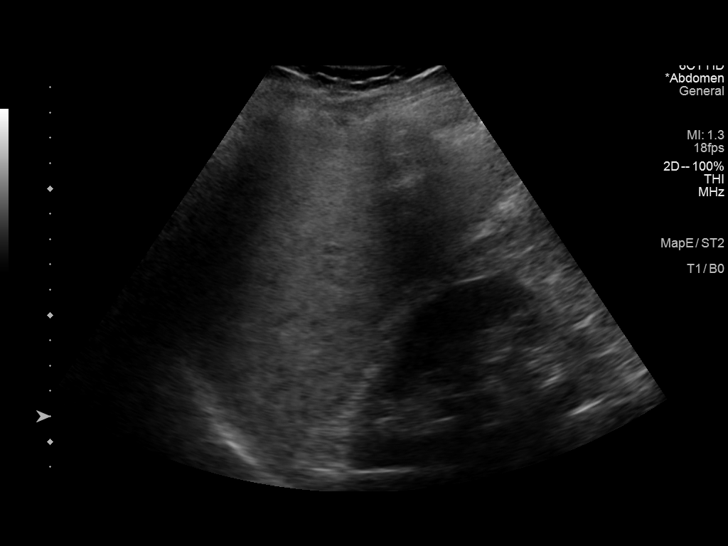
[im 29/53]
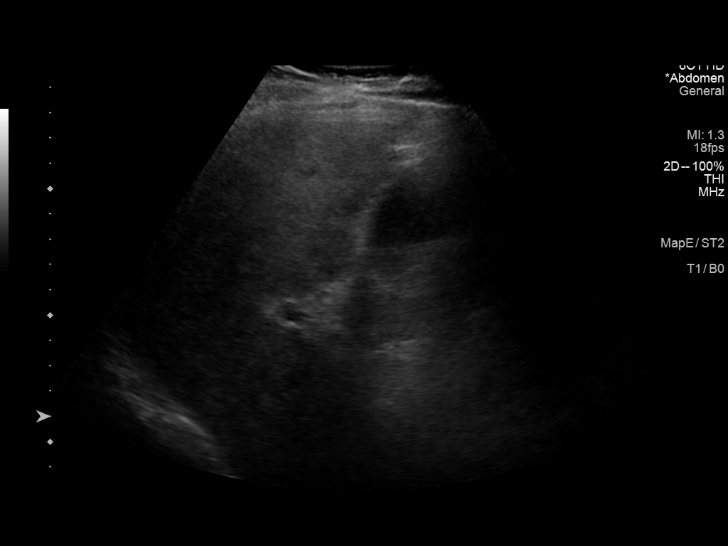
[im 33/53]
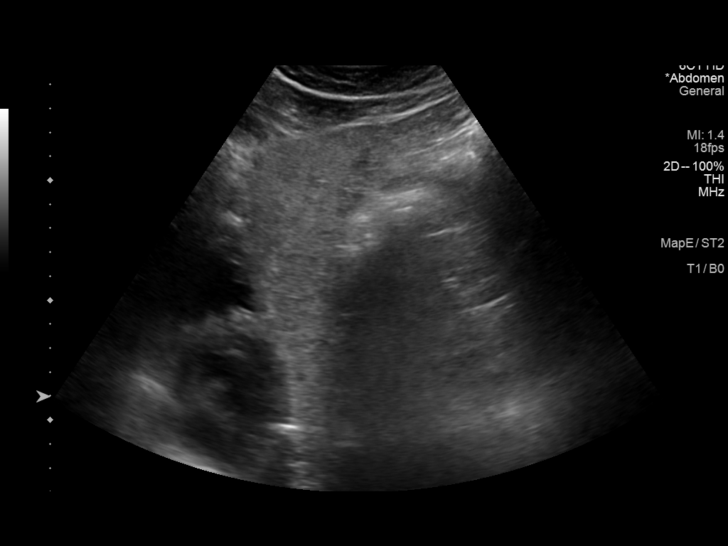
[im 35/53]
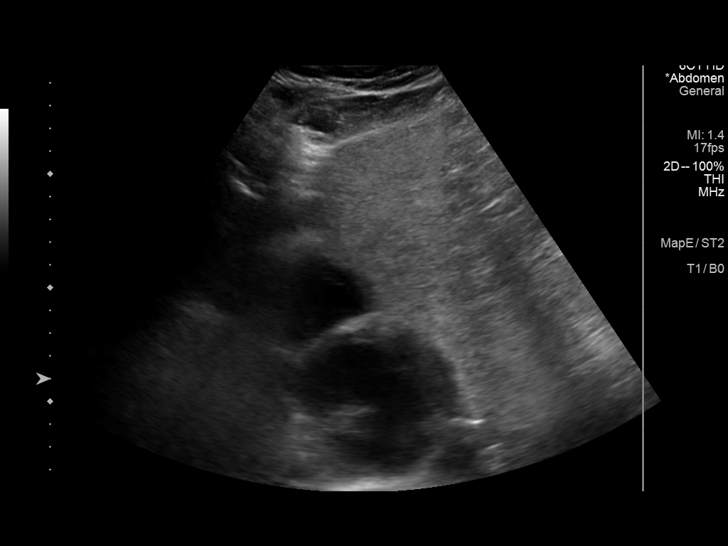
[im 40/53]
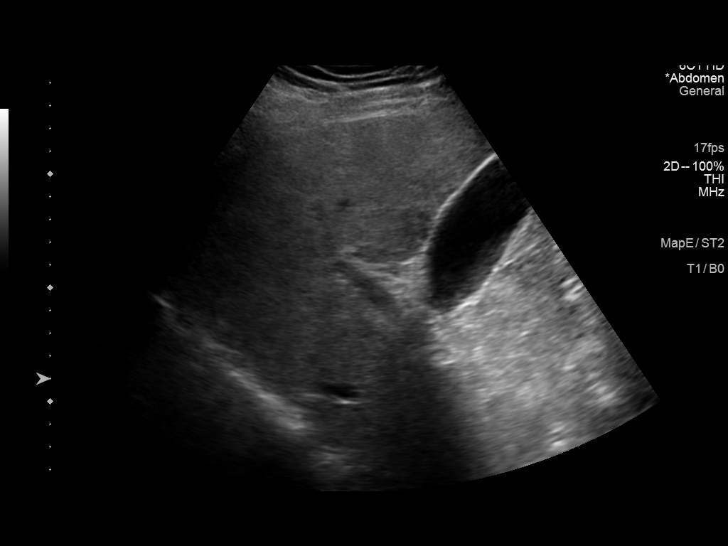
[im 44/53]
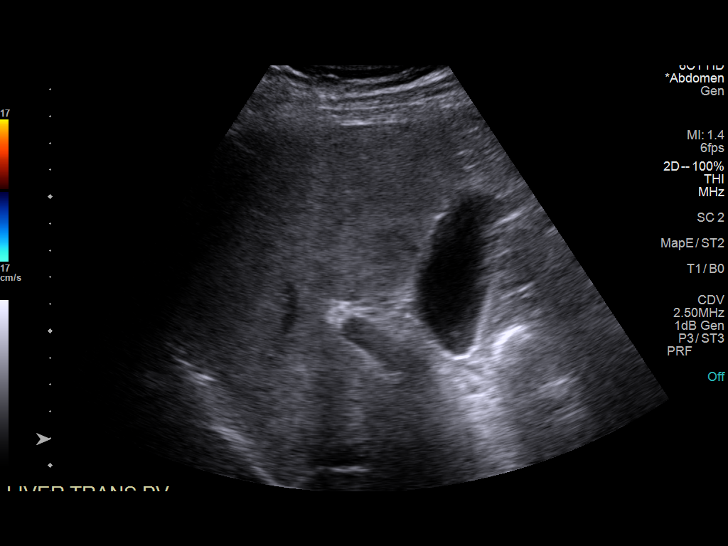
[im 48/53]
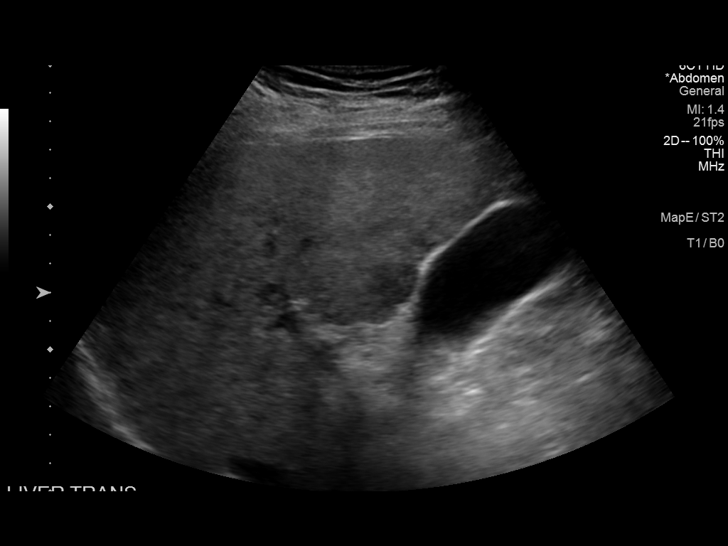
[im 53/53]
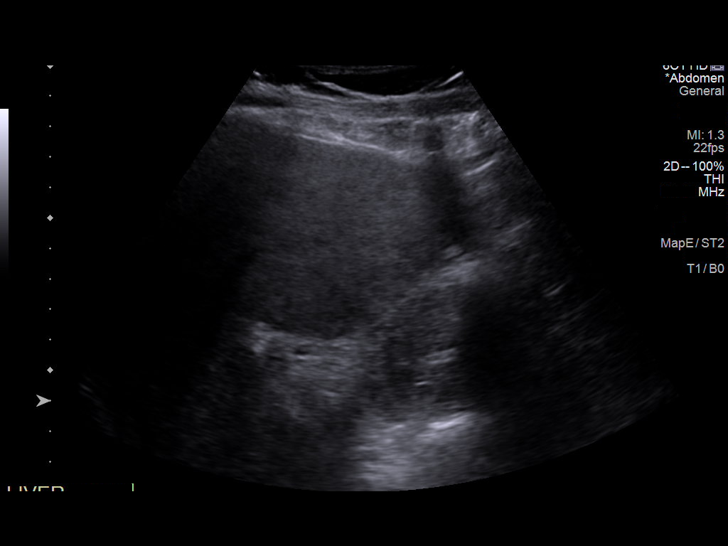

[14 of 25 positions shown; findings below may reference images not displayed]

FINDINGS: Gallbladder:

There is no evidence of cholelithiasis. Borderline gallbladder wall
thickening noted without sonographic Murphy sign or pericholecystic
fluid.

Common bile duct:

Diameter: 3 mm.  No intrahepatic or extrahepatic biliary dilatation.

Liver:

Heterogeneous diffuse increased hepatic echogenicity noted. No focal
hepatic lesions are present. Probable mild surface nodularity noted
likely representing cirrhosis. Portal vein is patent on color
Doppler imaging with normal direction of blood flow towards the
liver.

Other: None.
IMPRESSION: 1. Hepatic findings highly suggestive of cirrhosis and possible
hepatic steatosis.
2. No focal hepatic lesions.  No biliary dilatation.
3. Borderline gallbladder wall thickening, likely related to hepatic
dysfunction.
